# Patient Record
Sex: Female | Born: 1965 | Race: Black or African American | Hispanic: No | Marital: Married | Smoking: Never smoker
Health system: Southern US, Community
[De-identification: ages and names within clinical notes are randomized; demographics above are authoritative.]

## PROBLEM LIST (undated history)

## (undated) DIAGNOSIS — D649 Anemia, unspecified: Secondary | ICD-10-CM

## (undated) HISTORY — PX: BUNIONECTOMY: SHX129

## (undated) HISTORY — PX: ECTOPIC PREGNANCY SURGERY: SHX613

## (undated) HISTORY — PX: BREAST REDUCTION SURGERY: SHX8

---

## 2013-09-17 ENCOUNTER — Other Ambulatory Visit (HOSPITAL_COMMUNITY)
Admission: RE | Admit: 2013-09-17 | Discharge: 2013-09-17 | Disposition: A | Discharge status: Home / Self Care | Payer: 59 | Source: Ambulatory Visit | Attending: Obstetrics and Gynecology | Admitting: Obstetrics and Gynecology

## 2013-09-17 ENCOUNTER — Other Ambulatory Visit: Payer: Self-pay | Admitting: Obstetrics and Gynecology

## 2013-09-17 DIAGNOSIS — Z0000 Encounter for general adult medical examination without abnormal findings: Secondary | ICD-10-CM

## 2013-09-17 DIAGNOSIS — Z01419 Encounter for gynecological examination (general) (routine) without abnormal findings: Secondary | ICD-10-CM | POA: Insufficient documentation

## 2013-09-17 DIAGNOSIS — Z1151 Encounter for screening for human papillomavirus (HPV): Secondary | ICD-10-CM | POA: Insufficient documentation

## 2013-09-27 ENCOUNTER — Other Ambulatory Visit: Payer: Self-pay | Admitting: Obstetrics and Gynecology

## 2013-09-27 ENCOUNTER — Ambulatory Visit: Payer: Self-pay

## 2013-09-27 ENCOUNTER — Encounter (INDEPENDENT_AMBULATORY_CARE_PROVIDER_SITE_OTHER): Payer: Self-pay

## 2013-09-27 ENCOUNTER — Ambulatory Visit
Admission: RE | Admit: 2013-09-27 | Discharge: 2013-09-27 | Disposition: A | Discharge status: Home / Self Care | Payer: 59 | Source: Ambulatory Visit | Attending: Obstetrics and Gynecology | Admitting: Obstetrics and Gynecology

## 2013-09-27 DIAGNOSIS — Z1231 Encounter for screening mammogram for malignant neoplasm of breast: Secondary | ICD-10-CM

## 2013-09-28 ENCOUNTER — Other Ambulatory Visit: Payer: Self-pay | Admitting: Obstetrics and Gynecology

## 2013-09-28 DIAGNOSIS — R928 Other abnormal and inconclusive findings on diagnostic imaging of breast: Secondary | ICD-10-CM

## 2014-10-12 ENCOUNTER — Other Ambulatory Visit: Payer: Self-pay | Admitting: Obstetrics and Gynecology

## 2014-10-12 DIAGNOSIS — R921 Mammographic calcification found on diagnostic imaging of breast: Secondary | ICD-10-CM

## 2014-11-07 ENCOUNTER — Other Ambulatory Visit: Payer: Self-pay

## 2014-11-15 ENCOUNTER — Inpatient Hospital Stay: Admission: RE | Admit: 2014-11-15 | Payer: Self-pay | Source: Ambulatory Visit

## 2014-11-18 ENCOUNTER — Encounter (HOSPITAL_COMMUNITY)
Admission: RE | Admit: 2014-11-18 | Discharge: 2014-11-18 | Disposition: A | Discharge status: Home / Self Care | Payer: 59 | Source: Ambulatory Visit | Attending: Obstetrics and Gynecology | Admitting: Obstetrics and Gynecology

## 2014-11-18 ENCOUNTER — Encounter (HOSPITAL_COMMUNITY): Payer: Self-pay

## 2014-11-18 DIAGNOSIS — D649 Anemia, unspecified: Secondary | ICD-10-CM | POA: Insufficient documentation

## 2014-11-18 DIAGNOSIS — Z01812 Encounter for preprocedural laboratory examination: Secondary | ICD-10-CM | POA: Diagnosis present

## 2014-11-18 HISTORY — DX: Anemia, unspecified: D64.9

## 2014-11-18 LAB — CBC
HCT: 35.9 % — ABNORMAL LOW (ref 36.0–46.0)
Hemoglobin: 11.6 g/dL — ABNORMAL LOW (ref 12.0–15.0)
MCH: 26.8 pg (ref 26.0–34.0)
MCHC: 32.3 g/dL (ref 30.0–36.0)
MCV: 82.9 fL (ref 78.0–100.0)
PLATELETS: 311 10*3/uL (ref 150–400)
RBC: 4.33 MIL/uL (ref 3.87–5.11)
RDW: 15.1 % (ref 11.5–15.5)
WBC: 5.8 10*3/uL (ref 4.0–10.5)

## 2014-11-18 LAB — TYPE AND SCREEN
ABO/RH(D): O POS
Antibody Screen: NEGATIVE

## 2014-11-18 LAB — ABO/RH: ABO/RH(D): O POS

## 2014-11-18 NOTE — Patient Instructions (Signed)
   Your procedure is scheduled on: AUG 17 AT 1PM  Enter through the Main Entrance of [facility] at: [place] up the phone at the desk and dial [phone] and inform us of your arrival.  Please call this number if you have any problems the morning of surgery: [phone]  Remember: Do not eat food after midnight: AUG 16 (TUESDAY) Do not drink clear liquids after: 9AM DAY OF SURGERY  Do not wear jewelry, make-up, or FINGER nail polish No metal in your hair or on your body. Do not wear lotions, powders, perfumes.  You may wear deodorant.  Do not bring valuables to the hospital. Contacts, dentures or bridgework may not be worn into surgery.  Leave suitcase in the car. After Surgery it may be brought to your room. For patients being admitted to the hospital, checkout time is 11:00am the day of discharge.

## 2014-11-20 NOTE — Anesthesia Preprocedure Evaluation (Addendum)
Anesthesia Evaluation  Patient identified by MRN, date of birth, ID band Patient awake    Reviewed: Allergy & Precautions, NPO status , Patient's Chart, lab work & pertinent test results  Airway Mallampati: I   Neck ROM: Full    Dental  (+) Dental Advisory Given, Teeth Intact   Pulmonary neg pulmonary ROS,  breath sounds clear to auscultation        Cardiovascular negative cardio ROS  Rhythm:Regular     Neuro/Psych negative psych ROS   GI/Hepatic negative GI ROS, Neg liver ROS,   Endo/Other    Renal/GU negative Renal ROS     Musculoskeletal   Abdominal (+)  Abdomen: soft.    Peds  Hematology 11/36   Anesthesia Other Findings   Reproductive/Obstetrics                            Anesthesia Physical Anesthesia Plan  ASA: II  Anesthesia Plan: General   Post-op Pain Management:    Induction: Intravenous  Airway Management Planned: Oral ETT  Additional Equipment:   Intra-op Plan:   Post-operative Plan: Extubation in OR  Informed Consent: I have reviewed the patients History and Physical, chart, labs and discussed the procedure including the risks, benefits and alternatives for the proposed anesthesia with the patient or authorized representative who has indicated his/her understanding and acceptance.     Plan Discussed with:   Anesthesia Plan Comments:         Anesthesia Quick Evaluation

## 2014-11-22 MED ORDER — CEFAZOLIN SODIUM-DEXTROSE 2-3 GM-% IV SOLR
2.0000 g | INTRAVENOUS | Status: AC
  Administered 2014-11-23: 2 g via INTRAVENOUS

## 2014-11-23 ENCOUNTER — Encounter (HOSPITAL_COMMUNITY)
Admission: RE | Disposition: A | Discharge status: Home / Self Care | Payer: Self-pay | Source: Ambulatory Visit | Attending: Obstetrics and Gynecology

## 2014-11-23 ENCOUNTER — Ambulatory Visit (HOSPITAL_COMMUNITY): Payer: 59 | Admitting: Anesthesiology

## 2014-11-23 ENCOUNTER — Ambulatory Visit (HOSPITAL_COMMUNITY)
Admission: RE | Admit: 2014-11-23 | Discharge: 2014-11-24 | Disposition: A | Discharge status: Home / Self Care | Payer: 59 | Source: Ambulatory Visit | Attending: Obstetrics and Gynecology | Admitting: Obstetrics and Gynecology

## 2014-11-23 ENCOUNTER — Encounter (HOSPITAL_COMMUNITY): Payer: Self-pay | Admitting: *Deleted

## 2014-11-23 DIAGNOSIS — N72 Inflammatory disease of cervix uteri: Secondary | ICD-10-CM | POA: Insufficient documentation

## 2014-11-23 DIAGNOSIS — Z9079 Acquired absence of other genital organ(s): Secondary | ICD-10-CM | POA: Diagnosis not present

## 2014-11-23 DIAGNOSIS — N800 Endometriosis of uterus: Secondary | ICD-10-CM | POA: Diagnosis not present

## 2014-11-23 DIAGNOSIS — N879 Dysplasia of cervix uteri, unspecified: Secondary | ICD-10-CM | POA: Insufficient documentation

## 2014-11-23 DIAGNOSIS — Z885 Allergy status to narcotic agent status: Secondary | ICD-10-CM | POA: Diagnosis not present

## 2014-11-23 DIAGNOSIS — Z90710 Acquired absence of both cervix and uterus: Secondary | ICD-10-CM | POA: Diagnosis present

## 2014-11-23 DIAGNOSIS — D649 Anemia, unspecified: Secondary | ICD-10-CM | POA: Diagnosis not present

## 2014-11-23 DIAGNOSIS — G43909 Migraine, unspecified, not intractable, without status migrainosus: Secondary | ICD-10-CM | POA: Diagnosis not present

## 2014-11-23 DIAGNOSIS — N920 Excessive and frequent menstruation with regular cycle: Secondary | ICD-10-CM | POA: Diagnosis not present

## 2014-11-23 DIAGNOSIS — N802 Endometriosis of fallopian tube: Secondary | ICD-10-CM | POA: Insufficient documentation

## 2014-11-23 DIAGNOSIS — N814 Uterovaginal prolapse, unspecified: Secondary | ICD-10-CM | POA: Diagnosis present

## 2014-11-23 DIAGNOSIS — Z79899 Other long term (current) drug therapy: Secondary | ICD-10-CM | POA: Insufficient documentation

## 2014-11-23 DIAGNOSIS — D251 Intramural leiomyoma of uterus: Secondary | ICD-10-CM | POA: Diagnosis not present

## 2014-11-23 HISTORY — PX: VAGINAL HYSTERECTOMY: SHX2639

## 2014-11-23 HISTORY — PX: BILATERAL SALPINGECTOMY: SHX5743

## 2014-11-23 LAB — PREGNANCY, URINE: PREG TEST UR: NEGATIVE

## 2014-11-23 SURGERY — HYSTERECTOMY, VAGINAL
Anesthesia: General | Site: Uterus

## 2014-11-23 MED ORDER — BUPIVACAINE HCL (PF) 0.25 % IJ SOLN
INTRAMUSCULAR | Status: AC
  Filled 2014-11-23: qty 30

## 2014-11-23 MED ORDER — SCOPOLAMINE 1 MG/3DAYS TD PT72
MEDICATED_PATCH | TRANSDERMAL | Status: AC
  Administered 2014-11-23: 1.5 mg via TRANSDERMAL
  Filled 2014-11-23: qty 1

## 2014-11-23 MED ORDER — KETOROLAC TROMETHAMINE 30 MG/ML IJ SOLN
30.0000 mg | Freq: Four times a day (QID) | INTRAMUSCULAR | Status: DC
  Administered 2014-11-24 (×2): 30 mg via INTRAVENOUS
  Filled 2014-11-23 (×2): qty 1

## 2014-11-23 MED ORDER — KETOROLAC TROMETHAMINE 30 MG/ML IJ SOLN: 30.0000 mg | Freq: Once | INTRAMUSCULAR | Status: DC

## 2014-11-23 MED ORDER — FENTANYL CITRATE (PF) 100 MCG/2ML IJ SOLN
INTRAMUSCULAR | Status: AC
  Filled 2014-11-23: qty 2

## 2014-11-23 MED ORDER — LACTATED RINGERS IV SOLN
INTRAVENOUS | Status: DC
Start: 2014-11-23 — End: 2014-11-23
  Administered 2014-11-23 (×2): via INTRAVENOUS

## 2014-11-23 MED ORDER — SODIUM CHLORIDE 0.9 % IV SOLN
INTRAVENOUS | Status: DC | PRN
  Administered 2014-11-23: 16 mL via INTRAMUSCULAR

## 2014-11-23 MED ORDER — FLUMAZENIL 0.5 MG/5ML IV SOLN
INTRAVENOUS | Status: DC | PRN
  Administered 2014-11-23: 0.2 mg via INTRAVENOUS

## 2014-11-23 MED ORDER — HYDROMORPHONE HCL 1 MG/ML IJ SOLN
INTRAMUSCULAR | Status: AC
  Filled 2014-11-23: qty 1

## 2014-11-23 MED ORDER — KETOROLAC TROMETHAMINE 30 MG/ML IJ SOLN
INTRAMUSCULAR | Status: AC
  Filled 2014-11-23: qty 1

## 2014-11-23 MED ORDER — HYDROMORPHONE HCL 1 MG/ML IJ SOLN
0.2500 mg | INTRAMUSCULAR | Status: DC | PRN
  Administered 2014-11-23 (×2): 0.5 mg via INTRAVENOUS

## 2014-11-23 MED ORDER — PROPOFOL 10 MG/ML IV BOLUS
INTRAVENOUS | Status: DC | PRN
  Administered 2014-11-23: 30 mg via INTRAVENOUS
  Administered 2014-11-23: 170 mg via INTRAVENOUS

## 2014-11-23 MED ORDER — HYDROMORPHONE HCL 1 MG/ML IJ SOLN
0.2000 mg | INTRAMUSCULAR | Status: DC | PRN
  Administered 2014-11-23 (×2): 0.6 mg via INTRAVENOUS
  Filled 2014-11-23 (×2): qty 1

## 2014-11-23 MED ORDER — FENTANYL CITRATE (PF) 250 MCG/5ML IJ SOLN
INTRAMUSCULAR | Status: AC
  Filled 2014-11-23: qty 25

## 2014-11-23 MED ORDER — VASOPRESSIN 20 UNIT/ML IV SOLN
INTRAVENOUS | Status: AC
  Filled 2014-11-23: qty 1

## 2014-11-23 MED ORDER — SIMETHICONE 80 MG PO CHEW: 80.0000 mg | CHEWABLE_TABLET | Freq: Four times a day (QID) | ORAL | Status: DC | PRN

## 2014-11-23 MED ORDER — SCOPOLAMINE 1 MG/3DAYS TD PT72
1.0000 | MEDICATED_PATCH | Freq: Once | TRANSDERMAL | Status: DC
  Administered 2014-11-23: 1.5 mg via TRANSDERMAL

## 2014-11-23 MED ORDER — ONDANSETRON HCL 4 MG/2ML IJ SOLN
INTRAMUSCULAR | Status: AC
  Filled 2014-11-23: qty 2

## 2014-11-23 MED ORDER — PROMETHAZINE HCL 25 MG/ML IJ SOLN
6.2500 mg | INTRAMUSCULAR | Status: DC | PRN
Start: 2014-11-23 — End: 2014-11-23

## 2014-11-23 MED ORDER — ONDANSETRON HCL 4 MG PO TABS: 4.0000 mg | ORAL_TABLET | Freq: Four times a day (QID) | ORAL | Status: DC | PRN

## 2014-11-23 MED ORDER — TRAMADOL HCL 50 MG PO TABS
50.0000 mg | ORAL_TABLET | Freq: Four times a day (QID) | ORAL | Status: DC | PRN
  Administered 2014-11-24: 50 mg via ORAL
  Filled 2014-11-23: qty 1

## 2014-11-23 MED ORDER — IBUPROFEN 600 MG PO TABS: 600.0000 mg | ORAL_TABLET | Freq: Four times a day (QID) | ORAL | Status: DC | PRN

## 2014-11-23 MED ORDER — DEXAMETHASONE SODIUM PHOSPHATE 4 MG/ML IJ SOLN
INTRAMUSCULAR | Status: AC
  Filled 2014-11-23: qty 1

## 2014-11-23 MED ORDER — NALOXONE HCL 0.4 MG/ML IJ SOLN
INTRAMUSCULAR | Status: DC | PRN
  Administered 2014-11-23: 100 ug via INTRAVENOUS

## 2014-11-23 MED ORDER — KETOROLAC TROMETHAMINE 30 MG/ML IJ SOLN
INTRAMUSCULAR | Status: DC | PRN
  Administered 2014-11-23: 30 mg via INTRAVENOUS

## 2014-11-23 MED ORDER — FENTANYL CITRATE (PF) 100 MCG/2ML IJ SOLN
25.0000 ug | INTRAMUSCULAR | Status: DC | PRN
  Administered 2014-11-23: 50 ug via INTRAVENOUS

## 2014-11-23 MED ORDER — GLYCOPYRROLATE 0.2 MG/ML IJ SOLN
INTRAMUSCULAR | Status: AC
  Filled 2014-11-23: qty 3

## 2014-11-23 MED ORDER — MEPERIDINE HCL 25 MG/ML IJ SOLN: 6.2500 mg | INTRAMUSCULAR | Status: DC | PRN

## 2014-11-23 MED ORDER — LACTATED RINGERS IV SOLN
INTRAVENOUS | Status: DC
  Administered 2014-11-23 – 2014-11-24 (×3): via INTRAVENOUS

## 2014-11-23 MED ORDER — MIDAZOLAM HCL 2 MG/2ML IJ SOLN
INTRAMUSCULAR | Status: AC
  Filled 2014-11-23: qty 4

## 2014-11-23 MED ORDER — 0.9 % SODIUM CHLORIDE (POUR BTL) OPTIME
TOPICAL | Status: DC | PRN
  Administered 2014-11-23: 1000 mL

## 2014-11-23 MED ORDER — LIDOCAINE HCL (CARDIAC) 20 MG/ML IV SOLN
INTRAVENOUS | Status: DC | PRN
  Administered 2014-11-23: 70 mg via INTRAVENOUS
  Administered 2014-11-23: 30 mg via INTRAVENOUS

## 2014-11-23 MED ORDER — ONDANSETRON HCL 4 MG/2ML IJ SOLN
4.0000 mg | Freq: Four times a day (QID) | INTRAMUSCULAR | Status: DC | PRN
  Administered 2014-11-24: 4 mg via INTRAVENOUS
  Filled 2014-11-23: qty 2

## 2014-11-23 MED ORDER — ROCURONIUM BROMIDE 100 MG/10ML IV SOLN
INTRAVENOUS | Status: DC | PRN
  Administered 2014-11-23: 10 mg via INTRAVENOUS
  Administered 2014-11-23: 30 mg via INTRAVENOUS

## 2014-11-23 MED ORDER — DEXAMETHASONE SODIUM PHOSPHATE 10 MG/ML IJ SOLN
INTRAMUSCULAR | Status: DC | PRN
  Administered 2014-11-23: 4 mg via INTRAVENOUS

## 2014-11-23 MED ORDER — FENTANYL CITRATE (PF) 100 MCG/2ML IJ SOLN
INTRAMUSCULAR | Status: DC | PRN
  Administered 2014-11-23 (×3): 50 ug via INTRAVENOUS
  Administered 2014-11-23: 100 ug via INTRAVENOUS

## 2014-11-23 MED ORDER — MENTHOL 3 MG MT LOZG: 1.0000 | LOZENGE | OROMUCOSAL | Status: DC | PRN

## 2014-11-23 MED ORDER — DIPHENHYDRAMINE HCL 50 MG/ML IJ SOLN
25.0000 mg | Freq: Four times a day (QID) | INTRAMUSCULAR | Status: DC | PRN
  Administered 2014-11-24: 25 mg via INTRAVENOUS
  Filled 2014-11-23: qty 1

## 2014-11-23 MED ORDER — GLYCOPYRROLATE 0.2 MG/ML IJ SOLN
INTRAMUSCULAR | Status: DC | PRN
  Administered 2014-11-23: 0.4 mg via INTRAVENOUS

## 2014-11-23 MED ORDER — SENNOSIDES-DOCUSATE SODIUM 8.6-50 MG PO TABS: 1.0000 | ORAL_TABLET | Freq: Every evening | ORAL | Status: DC | PRN

## 2014-11-23 MED ORDER — MIDAZOLAM HCL 2 MG/2ML IJ SOLN
INTRAMUSCULAR | Status: DC | PRN
  Administered 2014-11-23: 2 mg via INTRAVENOUS

## 2014-11-23 MED ORDER — STERILE WATER FOR IRRIGATION IR SOLN
Status: DC | PRN
  Administered 2014-11-23: 1000 mL

## 2014-11-23 MED ORDER — FLUMAZENIL 0.5 MG/5ML IV SOLN
INTRAVENOUS | Status: AC
  Filled 2014-11-23: qty 5

## 2014-11-23 MED ORDER — PROPOFOL 10 MG/ML IV BOLUS
INTRAVENOUS | Status: AC
  Filled 2014-11-23: qty 20

## 2014-11-23 MED ORDER — SODIUM CHLORIDE 0.9 % IJ SOLN
INTRAMUSCULAR | Status: AC
  Filled 2014-11-23: qty 100

## 2014-11-23 MED ORDER — ONDANSETRON HCL 4 MG/2ML IJ SOLN
INTRAMUSCULAR | Status: DC | PRN
  Administered 2014-11-23: 4 mg via INTRAVENOUS

## 2014-11-23 MED ORDER — NEOSTIGMINE METHYLSULFATE 10 MG/10ML IV SOLN
INTRAVENOUS | Status: DC | PRN
  Administered 2014-11-23: 3 mg via INTRAVENOUS

## 2014-11-23 MED ORDER — HYDROMORPHONE HCL 1 MG/ML IJ SOLN
INTRAMUSCULAR | Status: DC | PRN
Start: 2014-11-23 — End: 2014-11-23
  Administered 2014-11-23: 1 mg via INTRAVENOUS

## 2014-11-23 MED ORDER — CEFAZOLIN SODIUM-DEXTROSE 2-3 GM-% IV SOLR
INTRAVENOUS | Status: AC
  Filled 2014-11-23: qty 50

## 2014-11-23 SURGICAL SUPPLY — 22 items
BNDG GAUZE ELAST 4 BULKY (GAUZE/BANDAGES/DRESSINGS) ×4 IMPLANT
CANISTER SUCT 3000ML (MISCELLANEOUS) ×4 IMPLANT
CLOTH BEACON ORANGE TIMEOUT ST (SAFETY) ×4 IMPLANT
CONT PATH 16OZ SNAP LID 3702 (MISCELLANEOUS) IMPLANT
DECANTER SPIKE VIAL GLASS SM (MISCELLANEOUS) ×4 IMPLANT
DRAPE STERI URO 9X17 APER PCH (DRAPES) ×4 IMPLANT
ELECT LIGASURE SHORT 9 REUSE (ELECTRODE) ×4 IMPLANT
GLOVE BIO SURGEON STRL SZ7 (GLOVE) ×4 IMPLANT
GLOVE BIOGEL PI IND STRL 7.0 (GLOVE) ×2 IMPLANT
GLOVE BIOGEL PI INDICATOR 7.0 (GLOVE) ×2
GOWN STRL REUS W/TWL LRG LVL3 (GOWN DISPOSABLE) ×16 IMPLANT
NS IRRIG 1000ML POUR BTL (IV SOLUTION) ×4 IMPLANT
PACK VAGINAL WOMENS (CUSTOM PROCEDURE TRAY) ×4 IMPLANT
PAD OB MATERNITY 4.3X12.25 (PERSONAL CARE ITEMS) ×4 IMPLANT
SUT VIC AB 0 CT1 18XCR BRD8 (SUTURE) ×6 IMPLANT
SUT VIC AB 0 CT1 36 (SUTURE) ×8 IMPLANT
SUT VIC AB 0 CT1 8-18 (SUTURE) ×6
SUT VICRYL 0 TIES 12 18 (SUTURE) ×4 IMPLANT
SYR BULB IRRIGATION 50ML (SYRINGE) ×4 IMPLANT
TOWEL OR 17X24 6PK STRL BLUE (TOWEL DISPOSABLE) ×8 IMPLANT
TRAY FOLEY CATH SILVER 14FR (SET/KITS/TRAYS/PACK) ×4 IMPLANT
WATER STERILE IRR 1000ML POUR (IV SOLUTION) ×4 IMPLANT

## 2014-11-23 NOTE — Anesthesia Procedure Notes (Signed)
Procedure Name: Intubation Date/Time: 11/23/2014 1:06 PM Performed by: Tobin Chad Pre-anesthesia Checklist: Patient identified, Timeout performed, Emergency Drugs available, Suction available and Patient being monitored Patient Re-evaluated:Patient Re-evaluated prior to inductionOxygen Delivery Method: Circle system utilized and Simple face mask Preoxygenation: Pre-oxygenation with 100% oxygen Intubation Type: IV induction Ventilation: Mask ventilation without difficulty Laryngoscope Size: Mac and 3 Grade View: Grade II Tube type: Oral Tube size: 7.0 mm Number of attempts: 1 Placement Confirmation: ETT inserted through vocal cords under direct vision,  positive ETCO2 and CO2 detector Dental Injury: Teeth and Oropharynx as per pre-operative assessment

## 2014-11-23 NOTE — Brief Op Note (Signed)
11/23/2014  2:41 PM  PATIENT:  Brianna Evans  49 y.o. female  PRE-OPERATIVE DIAGNOSIS:  N81.4  Uterine Prolapse  POST-OPERATIVE DIAGNOSIS:  N81.4  Uterine Prolapse, endometriosis  PROCEDURE:  Procedure(s): HYSTERECTOMY VAGINAL, McCall's Culdoplasty, Uterosacral Ligament Plaication (N/A) LEFT SALPINGECTOMY (Left)  SURGEON:  Surgeon(s) and Role:    * Thurnell Lose, MD - Primary    * Janyth Pupa, DO - Assisting  PHYSICIAN ASSISTANT:   ASSISTANTS: Technician   ANESTHESIA:   general  EBL:  Total I/O In: 1400 [I.V.:1400] Out: 500 [Urine:350; Blood:150]  BLOOD ADMINISTERED:none  DRAINS: Urinary Catheter (Foley)   LOCAL MEDICATIONS USED:  OTHER Vasopressin 20 units 100 normal saline  SPECIMEN:  Source of Specimen:  Uterus with cervix, left fallopian tube  DISPOSITION OF SPECIMEN:  PATHOLOGY  COUNTS:  YES  TOURNIQUET:  * No tourniquets in log *  DICTATION: .Other Dictation: Dictation Number [id number]  PLAN OF CARE: Admit for overnight observation  PATIENT DISPOSITION:  PACU - hemodynamically stable.   Delay start of Pharmacological VTE agent (>24hrs) due to surgical blood loss or risk of bleeding: yes

## 2014-11-23 NOTE — Interval H&P Note (Signed)
History and Physical Interval Note:  11/23/2014 12:44 PM  Brianna Evans  has presented today for surgery, with the diagnosis of N81.4  Uterine Prolapse  The various methods of treatment have been discussed with the patient and family. After consideration of risks, benefits and other options for treatment, the patient has consented to  Procedure(s) with comments: HYSTERECTOMY VAGINAL (N/A) - Possible Anterior or Posterior Repair BILATERAL SALPINGECTOMY (Bilateral) as a surgical intervention .  The patient's history has been reviewed, patient examined, no change in status, stable for surgery.  I have reviewed the patient's chart and labs.  Questions were answered to the patient's satisfaction.     Simona Huh, Jolynda Townley

## 2014-11-23 NOTE — Anesthesia Postprocedure Evaluation (Signed)
  Anesthesia Post-op Note  Patient: Brianna Evans  Procedure(s) Performed: Procedure(s): HYSTERECTOMY VAGINAL, McCall's Culdoplasty, Uterosacral Ligament Plaication (N/A) LEFT SALPINGECTOMY (Left)  Patient Location: PACU  Anesthesia Type:General  Level of Consciousness: awake  Airway and Oxygen Therapy: Patient Spontanous Breathing  Post-op Pain: mild  Post-op Assessment: Post-op Vital signs reviewed              Post-op Vital Signs: Reviewed  Last Vitals:  Filed Vitals:   11/23/14 1515  BP: 139/88  Pulse:   Temp:   Resp:     Complications: No apparent anesthesia complications

## 2014-11-23 NOTE — Transfer of Care (Signed)
Immediate Anesthesia Transfer of Care Note  Patient: Brianna Evans  Procedure(s) Performed: Procedure(s): HYSTERECTOMY VAGINAL, McCall's Culdoplasty, Uterosacral Ligament Plaication (N/A) LEFT SALPINGECTOMY (Left)  Patient Location: PACU  Anesthesia Type:General  Level of Consciousness: awake, oriented, sedated and patient cooperative  Airway & Oxygen Therapy: Patient Spontanous Breathing and Patient connected to nasal cannula oxygen  Post-op Assessment: Report given to RN and Post -op Vital signs reviewed and stable  Post vital signs: Reviewed and stable  Last Vitals:  Filed Vitals:   11/23/14 1130  BP: 157/91  Pulse: 67  Temp: 36.8 C  Resp: 20    Complications: No apparent anesthesia complications

## 2014-11-23 NOTE — H&P (Signed)
  History of Present Illness  General:  Pt presents for preop forr TVH/BS due to uterine prolapse. Pt has noticed a buldge in the vagina and eventually a mass at the vaginal introitus. Pt reports discomfort but not pain. Denies bladder or bowel issues.   Current Medications  None   Past Medical History  Migraines  H/o Anemia with Menorrhagia   Surgical History  Endometrial ablation-Novasure? 2009  Salpingectomy-right (ectopic) Age 49  Breast reduction 2003   Family History  Father: alive, hypertension  Mother: deceased, congested heart failure, type II diabetes, hypertension  Sister 1: Seizure d/o as a child  Sister 2: Lupus  2 brother(s) , 2 sister(s) .   denies any GYN family cancer hx, brothers in good health.   Social History  General:  Tobacco use  cigarettes: Never smoked Tobacco history last updated 11/18/2014 Exercise: NONE.  Marital Status: married.  no Exposed to passive smoke.  no Alcohol.  no Recreational drug use.  Children: none.    Gyn History  Sexual activity currently sexually active.  Periods : usually every month.  LMP 10/29/14.  Denies H/O Birth control.  Last pap smear date 09/17/13, all negative.  Last mammogram date 09/27/13.  Denies H/O Abnormal pap smear.  Denies H/O STD.    OB History  Number of pregnancies G1P0.  Pregnancy # 1 tubal pregnancy.    Allergies  Codeine (for allergy): hives   Hospitalization/Major Diagnostic Procedure  Denies Past Hospitalization   Review of Systems  Denies fever/chills, chest pain, SOB, headaches, numbness/tingling. No h/o complication with anesthesia, bleeding disorders or blood clots.   Vital Signs  Wt 189, Ht 67.5, BMI 29.16, BP sitting 152/90.   Physical Examination  GENERAL:  Patient appears alert and oriented.  General Appearance: well-appearing, well-developed, no acute distress.  Speech: clear.  LUNGS:  Auscultation: no wheezing/rhonchi/rales. CTA bilaterally.  HEART:  Heart sounds:  normal. RRR. no murmur.  ABDOMEN:  General: soft nontender, nondistended, no masses.  FEMALE GENITOURINARY:  Pelvic vuvla no lesions, normal vaginal discharge, no obvious cystocele. Cervix is long, prolapse is minimal today but it previously prolapsed to the introitus especially when upright.Marland Kitchen  EXTREMITIES:  General: No edema or calf tenderness.     Assessments   1. Pre-operative clearance - Z01.818 (Primary)   2. Uterine prolapse - N81.4   Treatment  1. Uterine prolapse  Notes: Pt counseled on R/B/A of hysterectomy. Will perform ant/post repair prn. Do not expect this will be necessary b/c support today is good. Cervix is long ~ 4 cm.    Follow Up  2 Weeks post op

## 2014-11-24 ENCOUNTER — Encounter (HOSPITAL_COMMUNITY): Payer: Self-pay | Admitting: Obstetrics and Gynecology

## 2014-11-24 DIAGNOSIS — N814 Uterovaginal prolapse, unspecified: Secondary | ICD-10-CM | POA: Diagnosis not present

## 2014-11-24 LAB — CBC
HCT: 32.4 % — ABNORMAL LOW (ref 36.0–46.0)
Hemoglobin: 10.3 g/dL — ABNORMAL LOW (ref 12.0–15.0)
MCH: 26.3 pg (ref 26.0–34.0)
MCHC: 31.8 g/dL (ref 30.0–36.0)
MCV: 82.7 fL (ref 78.0–100.0)
PLATELETS: 162 10*3/uL (ref 150–400)
RBC: 3.92 MIL/uL (ref 3.87–5.11)
RDW: 14.9 % (ref 11.5–15.5)
WBC: 9.8 10*3/uL (ref 4.0–10.5)

## 2014-11-24 MED ORDER — TRAMADOL HCL 50 MG PO TABS: 50.0000 mg | ORAL_TABLET | Freq: Four times a day (QID) | ORAL | Status: AC | PRN

## 2014-11-24 MED ORDER — IBUPROFEN 600 MG PO TABS: 600.0000 mg | ORAL_TABLET | Freq: Four times a day (QID) | ORAL | Status: AC | PRN

## 2014-11-24 NOTE — Addendum Note (Signed)
Addendum  created 11/24/14 0736 by Raenette Rover, CRNA   Modules edited: Notes Section   Notes Section:  File: [id number]

## 2014-11-24 NOTE — Discharge Instructions (Signed)

## 2014-11-24 NOTE — Anesthesia Postprocedure Evaluation (Signed)
  Anesthesia Post-op Note  Patient: Brianna Evans  Procedure(s) Performed: Procedure(s): HYSTERECTOMY VAGINAL, McCall's Culdoplasty, Uterosacral Ligament Plaication (N/A) LEFT SALPINGECTOMY (Left)  Patient Location: [facility]  Anesthesia Type:General  Level of Consciousness: awake, alert , oriented and patient cooperative  Airway and Oxygen Therapy: Patient Spontanous Breathing  Post-op Pain: none  Post-op Assessment: Post-op Vital signs reviewed, Patient's Cardiovascular Status Stable, Respiratory Function Stable, Patent Airway and No signs of Nausea or vomiting              Post-op Vital Signs: Reviewed and stable  Last Vitals:  Filed Vitals:   11/24/14 0527  BP: 140/75  Pulse: 76  Temp: 36.9 C  Resp: 14    Complications: No apparent anesthesia complications

## 2014-11-25 NOTE — Op Note (Signed)
NAMEJESSIKA, Brianna Evans NO.:  [id number]  MEDICAL RECORD NO.:  [id number]  LOCATION:  3299                          FACILITY:  [facility]  PHYSICIAN:  Jola Schmidt, MD   DATE OF BIRTH:  [birth year]  DATE OF PROCEDURE:  11/24/2014 DATE OF DISCHARGE:  11/24/2014                              OPERATIVE REPORT   PREOPERATIVE DIAGNOSIS:  Uterine prolapse.  POSTOPERATIVE DIAGNOSES:  Uterine prolapse, endometriosis.  PROCEDURE:  Vaginal hysterectomy, McCall culdoplasty, uterosacral ligament plication, and left salpingectomy.  SURGEON:  Jola Schmidt, MD  ASSISTANT:  Dr. Janyth Pupa and technician.  ANESTHESIA:  General.  EBL:  150.  URINE:  350 clear.  DRAINS:  Foley catheter.  LOCAL:  Vasopressin 20 units and 100 of normal saline.  SPECIMEN:  Uterus with cervix and left fallopian tube to Pathology.  DISPOSITION:  To PACU hemodynamically stable.  COMPLICATIONS:  None.  FINDINGS:  Normal uterus.  Cervix approximately 5 to 6 cm.  Right ovary appears normal.  Left endometriosis noted at the cornua of the uterus. Left ovary appears small, atrophic.  No significant pelvic adhesions noted.  PROCEDURE IN DETAIL:  The patient was identified in the holding area. She was then taken to the operating room.  She underwent general endotracheal anesthesia without complications.  She was then placed in the dorsal supine position and prepped and draped in a normal sterile fashion.  Time-out was performed prior to the start of the procedure. She received Ancef 2 g IV.  A weighted speculum was placed in the vagina and a curved Deaver to visualize the cervix, which was very easy.  The cervix was in the lower 1/3rd of the vagina.  No significant cystocele, mild rectocele noted. The cervical mucosa was then injected with vasopressin until adequate blanching noted.  The cervix was then circumscribed with the Bovie.  The posterior cul-de-sac was entered sharply with the  Mayo scissors. Because of the long cervix, it was a little challenging to go further back to find the peritoneum, but that was entered sharply and safely and a long weighted speculum was placed.  The same was done on the top before I entered the anterior cul-de-sac though due to the length of the cervix, I did tag the uterosacrals with 0-Vicryl on each side, grasped with Haney clamp and cut.  I then eventually got into the anterior cul- de-sac safely and put a narrow Deaver on the inside.  Due to the length of the cervix, I could palpate fundus.  I grasped a little bit more of the uterosacral ligament a little higher and tagged that and cut the other stitches.  Once we were in, I used the LigaSure on both sides to cauterize the uterine arteries and ligaments to the cornu of the uterus.  On the right side, she had a surgically absent fallopian tube.  There was no scarring.  Transected that easily.  Saw the ovary.  I did grasp that with a stitch and that would eventually be cut.  On the other side, there was some endometriosis noted on the tube and on the cornual portion of the fallopian tubes.  Because of the endometriosis and as I wanted to visualize  the ovary well, I removed the fimbriated end of the fallopian tube with the LigaSure.  Then, the ovary initially appeared to be adhered to the uterus or close to the uterus, but I was able to get a good bite in between the uterus and the ovary, and I grasped that and I did a freehand tie and then a Haney stitch around it and that was tagged.  It was not actively bleeding.  I Bovied a little bit for hemostasis.  Both pedicles appeared dry.  The specimen was removed easily.  During the procedure, the patient was placed in the lithotomy and moistened 4 x 18 was used to retract the bowel.  I then performed a McCall culdoplasty with 0 Vicryl in a routine fashion.  I was able to get high up in the posterior cul-de-sac.  The McCall's stitch was  then tied and then I plicated the uterosacral ligaments together and the patient had great support.  I did close the vaginal mucosa with 0-Vicryl vertically.  The length of the vagina was still appropriate.  She had good support.  A moistened Kerlix roll was placed in the vagina.  All instruments were removed from the vagina.  All instruments, sponge, and needle counts were correct x3.  The patient tolerated the procedure well.  She went to the recovery room in stable condition.  SCDs were on the entire case and operating.     Jola Schmidt, MD     EBV/MEDQ  D:  11/24/2014  T:  11/25/2014  Job:  [id number]

## 2014-12-22 NOTE — Discharge Summary (Signed)
Physician Discharge Summary  Patient ID: Brianna Evans MRN: [id number] DOB/AGE: Mar 07, 1966 49 y.o.  Admit date: 11/23/2014 Discharge date: 12/22/2014  Admission Diagnoses:Uterine prolapse  Discharge Diagnoses:  Active Problems:   S/P vaginal hysterectomy   Discharged Condition: good  Hospital Course: Post operative course uncomplicated. Pain well controlled, minimal bleeding, pt ambulated well, tolerated diet.  Consults: None  Significant Diagnostic Studies: None  Treatments: surgery: TVH, McCall's Culdoplasty,Plication of uterosacral ligaments.  Discharge Exam: Blood pressure 132/62, pulse 95, temperature 98.1 F (36.7 C), temperature source Oral, resp. rate 18, height 5\' 6"  (1.676 m), weight 85.73 kg (189 lb), last menstrual period 11/20/2014, SpO2 99 %. Gen:  NAD Abd:  Soft, nontender Ext:  No edema of calf tenderness.  Disposition: 01-Home or Self Care  Discharge Instructions    Call MD for:  hives    Complete by:  As directed      Call MD for:  persistant dizziness or light-headedness    Complete by:  As directed      Call MD for:  persistant nausea and vomiting    Complete by:  As directed      Call MD for:  severe uncontrolled pain    Complete by:  As directed      Diet - low sodium heart healthy    Complete by:  As directed      Discharge instructions    Complete by:  As directed   See discharge instructions.     Increase activity slowly    Complete by:  As directed      Lifting restrictions    Complete by:  As directed   No heavy lifting greater than 15 pounds.     No wound care    Complete by:  As directed      Sexual Activity Restrictions    Complete by:  As directed   Pelvic rest x 6 weeks.            Medication List    TAKE these medications        ibuprofen 600 MG tablet  Commonly known as:  ADVIL,MOTRIN  Take 1 tablet (600 mg total) by mouth every 6 (six) hours as needed (mild pain).     traMADol 50 MG tablet  Commonly known as:   ULTRAM  Take 1 tablet (50 mg total) by mouth every 6 (six) hours as needed for moderate pain.           Follow-up Information    Follow up with Thurnell Lose, MD. Schedule an appointment as soon as possible for a visit in 2 weeks.   Specialty:  Obstetrics and Gynecology   Why:  Surgery follow up   Contact information:   [address] [place] [address] [phone]       Signed: Thurnell Lose 12/22/2014, 7:20 AM

## 2015-03-01 IMAGING — MG MM DIGITAL SCREENING BILAT W/ CAD
6 series · 6 of 6 positions shown · non-contrast
Comparison: None

CLINICAL DATA: Screening.

EXAM:
DIGITAL SCREENING BILATERAL MAMMOGRAM WITH CAD

[R CC (1 of 2)]
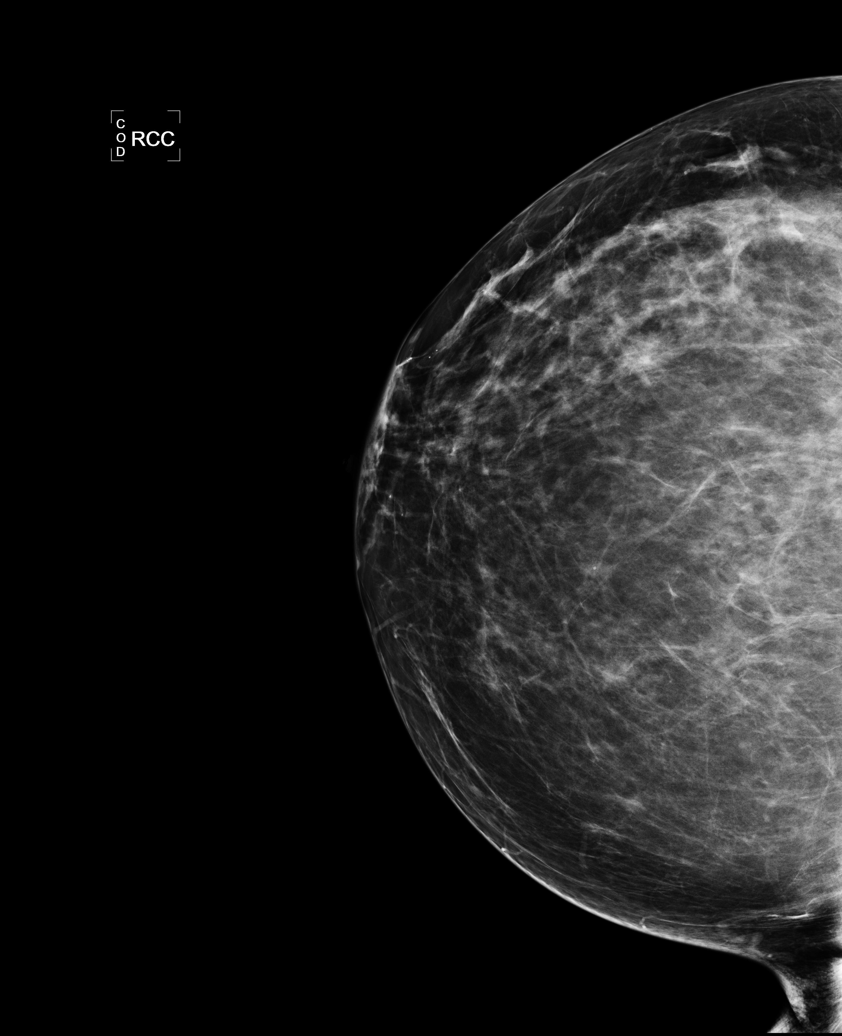

[L CC (1 of 2)]
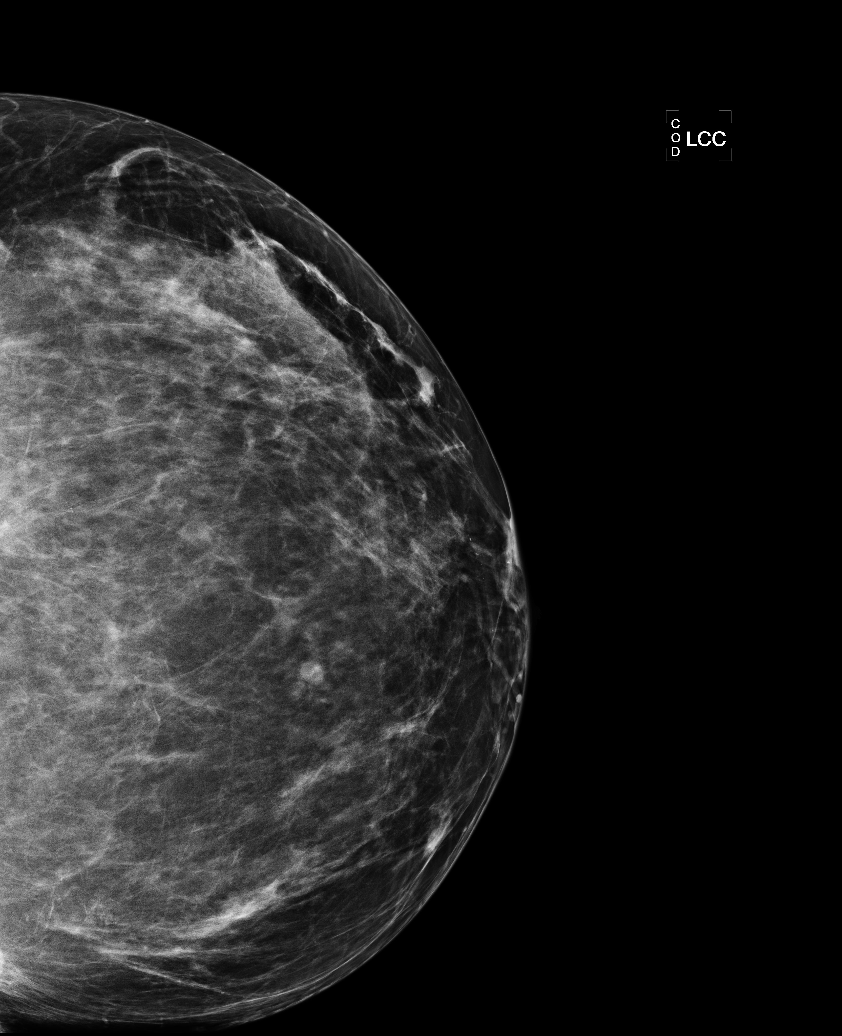

[L MLO]
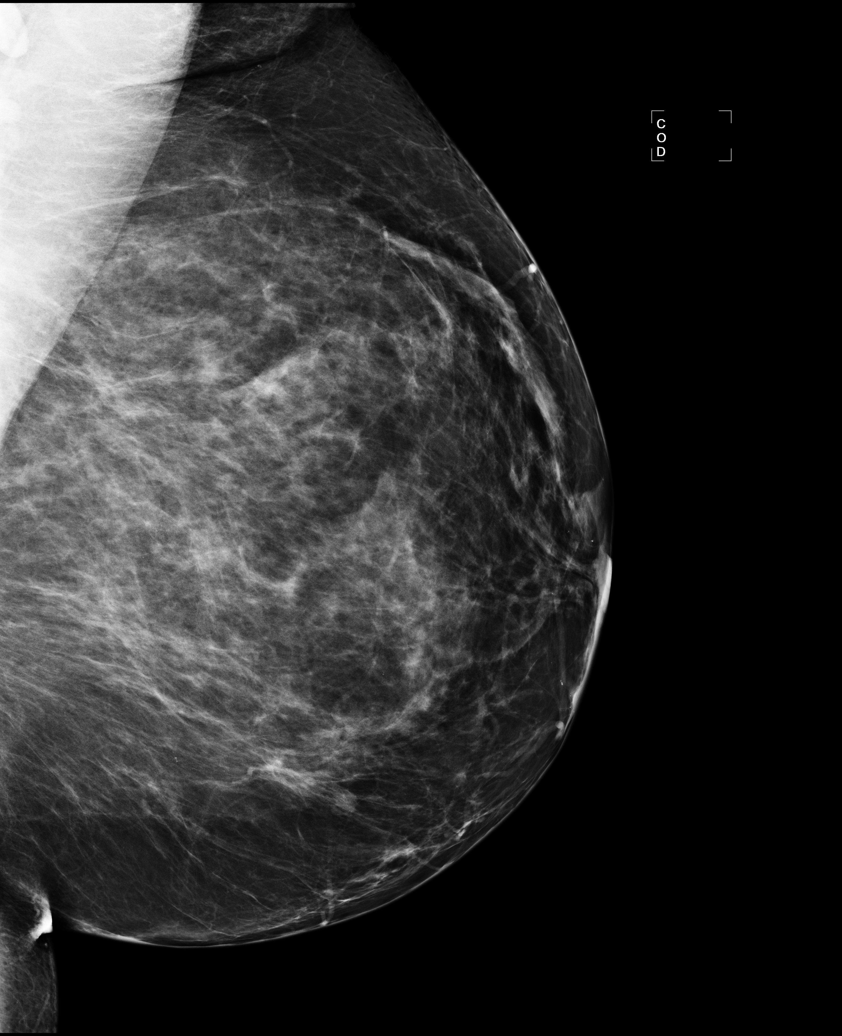

[R MLO]
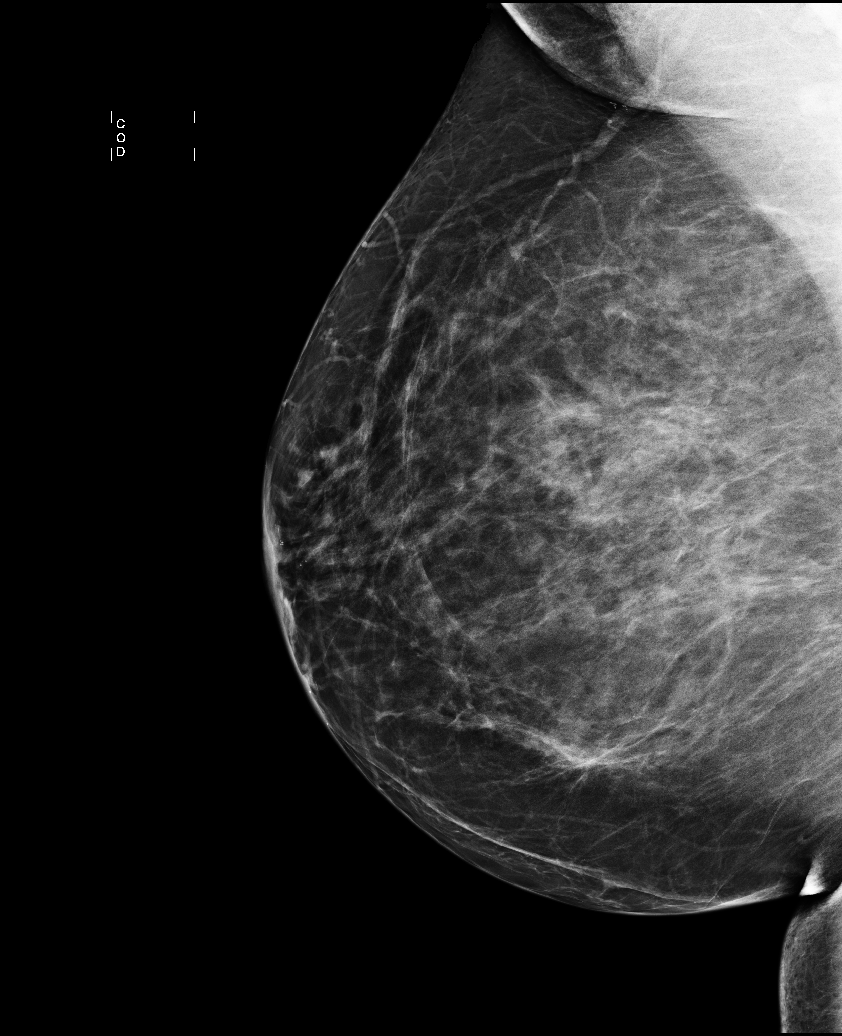

[L CC (2 of 2)]
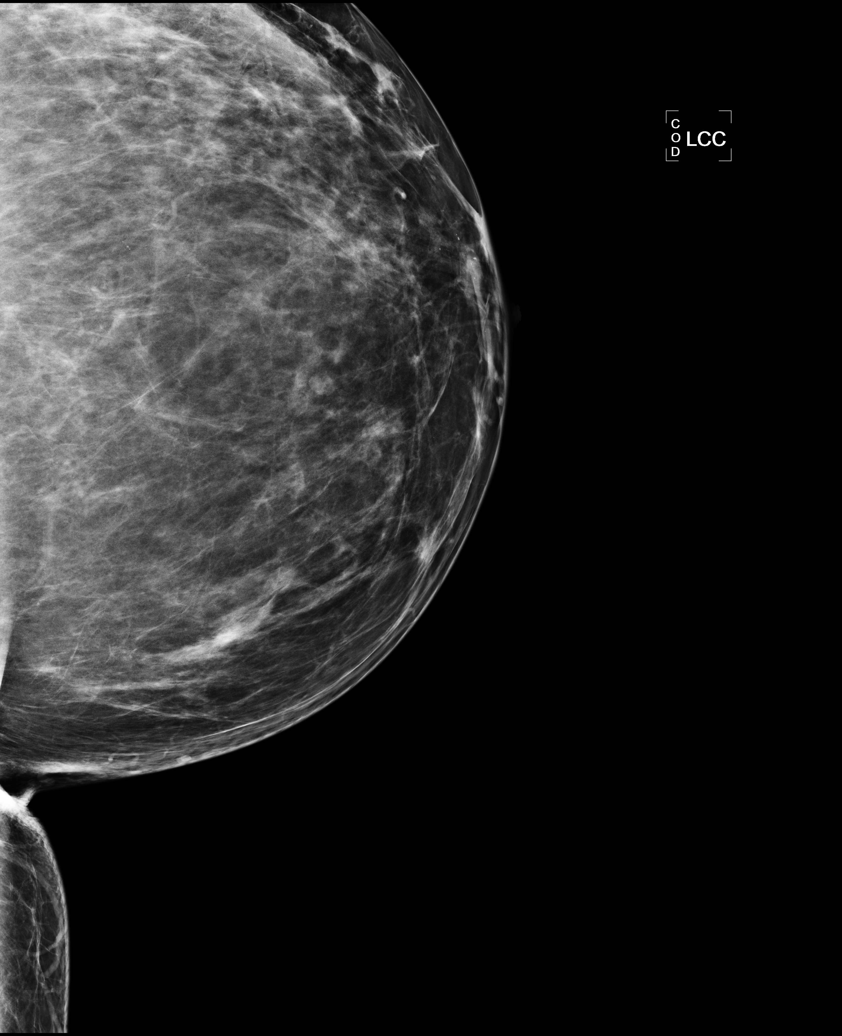

[R CC (2 of 2)]
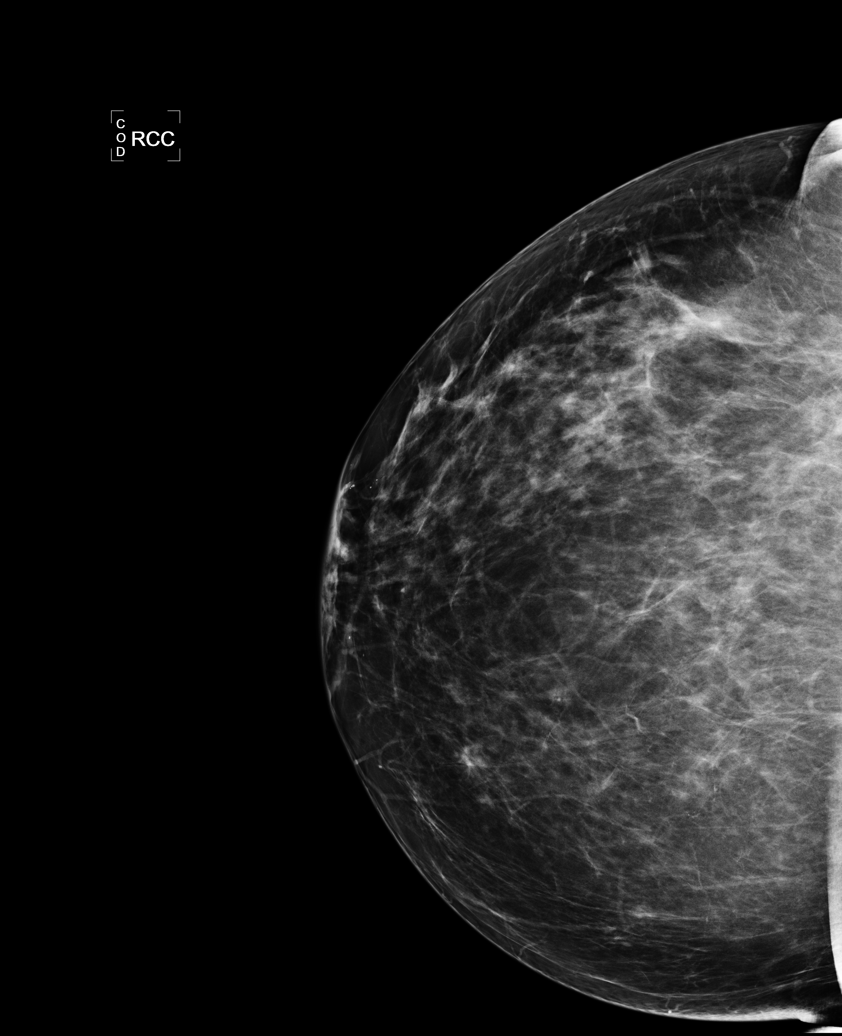

[6 of 6 positions shown; findings below may reference images not displayed]

ACR Breast Density Category c: The breast tissue is heterogeneously
dense, which may obscure small masses.
FINDINGS: In the left breast there are 2 areas of calcification and 1 possible
mass, which warrant further imaging evaluation with magnified views
and spot compression views and possibly ultrasound. In the right
breast, calcifications warrant further imaging evaluation with
magnified views. Images were processed with CAD.
IMPRESSION: Further imaging evaluation is suggested for possible mass and
calcifications in the left breast.

Further imaging evaluation is suggested for calcifications in the
right breast.

RECOMMENDATION:
Diagnostic mammogram and possibly ultrasound of both breasts.
(Code:7S-8-SSW)

The patient will be contacted regarding the findings, and additional
imaging will be scheduled.

BI-RADS CATEGORY  0: Incomplete. Need additional imaging evaluation
and/or prior mammograms for comparison.
